# Patient Record
Sex: Male | Born: 1980 | Race: Black or African American | Hispanic: No | Marital: Single | Smoking: Former smoker
Health system: Southern US, Community
[De-identification: ages and names within clinical notes are randomized; demographics above are authoritative.]

---

## 2003-03-06 ENCOUNTER — Emergency Department (HOSPITAL_COMMUNITY): Admission: EM | Admit: 2003-03-06 | Discharge: 2003-03-06 | Payer: Self-pay | Admitting: Emergency Medicine

## 2005-10-27 ENCOUNTER — Encounter: Admission: RE | Admit: 2005-10-27 | Discharge: 2005-10-27 | Payer: Self-pay | Admitting: Emergency Medicine

## 2005-11-29 ENCOUNTER — Ambulatory Visit (HOSPITAL_COMMUNITY): Admission: RE | Admit: 2005-11-29 | Discharge: 2005-11-29 | Payer: Self-pay | Admitting: Emergency Medicine

## 2006-02-11 ENCOUNTER — Inpatient Hospital Stay (HOSPITAL_COMMUNITY): Admission: EM | Admit: 2006-02-11 | Discharge: 2006-02-13 | Payer: Self-pay | Admitting: Emergency Medicine

## 2015-12-30 ENCOUNTER — Encounter (HOSPITAL_BASED_OUTPATIENT_CLINIC_OR_DEPARTMENT_OTHER): Payer: Self-pay | Admitting: Emergency Medicine

## 2015-12-30 ENCOUNTER — Emergency Department (HOSPITAL_BASED_OUTPATIENT_CLINIC_OR_DEPARTMENT_OTHER): Payer: No Typology Code available for payment source

## 2015-12-30 ENCOUNTER — Emergency Department (HOSPITAL_BASED_OUTPATIENT_CLINIC_OR_DEPARTMENT_OTHER)
Admission: EM | Admit: 2015-12-30 | Discharge: 2015-12-31 | Disposition: A | Discharge status: Home / Self Care | Payer: No Typology Code available for payment source | Attending: Emergency Medicine | Admitting: Emergency Medicine

## 2015-12-30 DIAGNOSIS — Y9389 Activity, other specified: Secondary | ICD-10-CM | POA: Insufficient documentation

## 2015-12-30 DIAGNOSIS — S3992XA Unspecified injury of lower back, initial encounter: Secondary | ICD-10-CM | POA: Diagnosis present

## 2015-12-30 DIAGNOSIS — Y92410 Unspecified street and highway as the place of occurrence of the external cause: Secondary | ICD-10-CM | POA: Insufficient documentation

## 2015-12-30 DIAGNOSIS — Z87891 Personal history of nicotine dependence: Secondary | ICD-10-CM | POA: Insufficient documentation

## 2015-12-30 DIAGNOSIS — S39012A Strain of muscle, fascia and tendon of lower back, initial encounter: Secondary | ICD-10-CM | POA: Insufficient documentation

## 2015-12-30 DIAGNOSIS — M542 Cervicalgia: Secondary | ICD-10-CM | POA: Insufficient documentation

## 2015-12-30 DIAGNOSIS — Y999 Unspecified external cause status: Secondary | ICD-10-CM | POA: Insufficient documentation

## 2015-12-30 MED ORDER — HYDROCODONE-ACETAMINOPHEN 5-325 MG PO TABS: 1.0000 | ORAL_TABLET | Freq: Four times a day (QID) | ORAL | 0 refills | Status: AC | PRN

## 2015-12-30 MED ORDER — HYDROCODONE-ACETAMINOPHEN 5-325 MG PO TABS
1.0000 | ORAL_TABLET | Freq: Once | ORAL | Status: AC
  Administered 2015-12-31: 1 via ORAL
  Filled 2015-12-30: qty 1

## 2015-12-30 MED ORDER — NAPROXEN 500 MG PO TABS: 500.0000 mg | ORAL_TABLET | Freq: Two times a day (BID) | ORAL | 0 refills | Status: AC

## 2015-12-30 NOTE — ED Notes (Signed)
MD at bedside. 

## 2015-12-30 NOTE — Discharge Instructions (Signed)
Turn for the development of any abdominal pain. Expect extra muscle soreness over the next 2 days. X-rays of your chest and lumbar back without any acute findings. Take the Naprosyn on a regular basis. Supplemented with the hydrocodone as needed for additional pain control.
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
## 2015-12-30 NOTE — ED Notes (Signed)
Patient transported to X-ray 
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
## 2019-06-20 ENCOUNTER — Ambulatory Visit: Payer: Self-pay | Attending: Internal Medicine

## 2020-02-14 ENCOUNTER — Ambulatory Visit: Payer: Self-pay | Attending: Internal Medicine

## 2020-02-14 ENCOUNTER — Other Ambulatory Visit (HOSPITAL_BASED_OUTPATIENT_CLINIC_OR_DEPARTMENT_OTHER): Payer: Self-pay | Admitting: Internal Medicine

## 2020-02-14 DIAGNOSIS — Z23 Encounter for immunization: Secondary | ICD-10-CM

## 2020-02-17 NOTE — Progress Notes (Signed)
   [facility] Vaccination [facility]  Name:  Arlow Spiers    MRN: [id number] DOB: 1981/02/24  02/17/2020  Mr. Norrington was observed post Covid-19 immunization for 15 minutes without incident. He was provided with Vaccine Information Sheet and instruction to access the V-Safe system.   Mr. Leonetti was instructed to call 911 with any severe reactions post vaccine: Marland Kitchen Difficulty breathing  . Swelling of face and throat  . A fast heartbeat  . A bad rash all over body  . Dizziness and weakness

## 2020-02-21 MED FILL — MODERNA COVID-19 VACCINE 10: 100 | 1 days supply | Qty: 0 | Fill #0
